# Patient Record
Sex: Male | Born: 1970
Health system: Southern US, Community
[De-identification: ages and names within clinical notes are randomized; demographics above are authoritative.]

---

## 2005-05-06 ENCOUNTER — Emergency Department (HOSPITAL_COMMUNITY): Admission: EM | Admit: 2005-05-06 | Discharge: 2005-05-06 | Payer: Self-pay | Admitting: Family Medicine

## 2005-12-14 ENCOUNTER — Ambulatory Visit (HOSPITAL_BASED_OUTPATIENT_CLINIC_OR_DEPARTMENT_OTHER): Admission: RE | Admit: 2005-12-14 | Discharge: 2005-12-14 | Payer: Self-pay | Admitting: Emergency Medicine

## 2005-12-16 ENCOUNTER — Ambulatory Visit: Payer: Self-pay | Admitting: Internal Medicine

## 2006-02-26 ENCOUNTER — Encounter: Admission: RE | Admit: 2006-02-26 | Discharge: 2006-05-27 | Payer: Self-pay | Admitting: Emergency Medicine

## 2006-03-04 ENCOUNTER — Ambulatory Visit (HOSPITAL_BASED_OUTPATIENT_CLINIC_OR_DEPARTMENT_OTHER): Admission: RE | Admit: 2006-03-04 | Discharge: 2006-03-04 | Payer: Self-pay | Admitting: Emergency Medicine

## 2006-03-11 ENCOUNTER — Ambulatory Visit: Payer: Self-pay | Admitting: Internal Medicine

## 2006-05-11 ENCOUNTER — Ambulatory Visit: Payer: Self-pay | Admitting: Pulmonary Disease

## 2007-04-16 ENCOUNTER — Ambulatory Visit: Payer: Self-pay | Admitting: Family Medicine

## 2007-04-16 DIAGNOSIS — J309 Allergic rhinitis, unspecified: Secondary | ICD-10-CM | POA: Insufficient documentation

## 2007-04-16 DIAGNOSIS — I10 Essential (primary) hypertension: Secondary | ICD-10-CM | POA: Insufficient documentation

## 2007-12-19 ENCOUNTER — Ambulatory Visit: Payer: Self-pay | Admitting: Family Medicine

## 2007-12-19 DIAGNOSIS — J019 Acute sinusitis, unspecified: Secondary | ICD-10-CM | POA: Insufficient documentation

## 2008-01-13 ENCOUNTER — Ambulatory Visit: Payer: Self-pay | Admitting: Family Medicine

## 2008-01-13 LAB — CONVERTED CEMR LAB
Glucose, Urine, Semiquant: NEGATIVE
Ketones, urine, test strip: NEGATIVE
Nitrite: NEGATIVE
Protein, U semiquant: NEGATIVE
WBC Urine, dipstick: NEGATIVE
pH: 5.5

## 2008-01-15 LAB — CONVERTED CEMR LAB
ALT: 50 units/L (ref 0–53)
BUN: 12 mg/dL (ref 6–23)
Basophils Relative: 0.7 % (ref 0.0–3.0)
Cholesterol: 210 mg/dL (ref 0–200)
Direct LDL: 122.1 mg/dL
GFR calc non Af Amer: 89 mL/min
Glucose, Bld: 105 mg/dL — ABNORMAL HIGH (ref 70–99)
HDL: 20.4 mg/dL — ABNORMAL LOW (ref >39.0)
MCV: 87 fL (ref 78.0–100.0)
Neutrophils Relative %: 51.5 % (ref 43.0–77.0)
Platelets: 204 10*3/uL (ref 150–400)
Potassium: 4.4 meq/L (ref 3.5–5.1)
TSH: 1.56 microintl units/mL (ref 0.35–5.50)
Total CHOL/HDL Ratio: 10.3
Total Protein: 7.4 g/dL (ref 6.0–8.3)
VLDL: 72 mg/dL — ABNORMAL HIGH (ref 0–40)
WBC: 7.3 10*3/uL (ref 4.5–10.5)

## 2008-01-20 ENCOUNTER — Ambulatory Visit: Payer: Self-pay | Admitting: Family Medicine

## 2008-06-16 ENCOUNTER — Ambulatory Visit: Payer: Self-pay | Admitting: Family Medicine

## 2008-06-16 ENCOUNTER — Encounter: Payer: Self-pay | Admitting: Family Medicine

## 2008-06-16 DIAGNOSIS — L723 Sebaceous cyst: Secondary | ICD-10-CM | POA: Insufficient documentation

## 2008-06-30 ENCOUNTER — Ambulatory Visit: Payer: Self-pay | Admitting: Family Medicine

## 2008-09-07 ENCOUNTER — Ambulatory Visit: Payer: Self-pay | Admitting: Family Medicine

## 2008-09-07 DIAGNOSIS — R5381 Other malaise: Secondary | ICD-10-CM | POA: Insufficient documentation

## 2008-09-07 DIAGNOSIS — R5383 Other fatigue: Secondary | ICD-10-CM

## 2008-09-09 LAB — CONVERTED CEMR LAB
BUN: 14 mg/dL (ref 6–23)
Basophils Absolute: 0.1 10*3/uL (ref 0.0–0.1)
Bilirubin, Direct: 0 mg/dL (ref 0.0–0.3)
CO2: 27 meq/L (ref 19–32)
Eosinophils Absolute: 0.3 10*3/uL (ref 0.0–0.7)
HCT: 46.8 % (ref 39.0–52.0)
Hemoglobin: 16.4 g/dL (ref 13.0–17.0)
MCHC: 35.1 g/dL (ref 30.0–36.0)
MCV: 86.3 fL (ref 78.0–100.0)
Monocytes Absolute: 0.6 10*3/uL (ref 0.1–1.0)
Platelets: 232 10*3/uL (ref 150.0–400.0)
RBC: 5.42 M/uL (ref 4.22–5.81)
Sodium: 140 meq/L (ref 135–145)
Testosterone: 89.2 ng/dL — ABNORMAL LOW (ref 350.00–890.00)
Total Bilirubin: 0.9 mg/dL (ref 0.3–1.2)
WBC: 8.2 10*3/uL (ref 4.5–10.5)

## 2009-03-12 ENCOUNTER — Emergency Department (HOSPITAL_COMMUNITY): Admission: EM | Admit: 2009-03-12 | Discharge: 2009-03-12 | Payer: Self-pay | Admitting: Emergency Medicine

## 2009-06-22 ENCOUNTER — Ambulatory Visit: Payer: Self-pay | Admitting: Family Medicine

## 2009-06-22 DIAGNOSIS — E291 Testicular hypofunction: Secondary | ICD-10-CM | POA: Insufficient documentation

## 2009-06-22 DIAGNOSIS — N410 Acute prostatitis: Secondary | ICD-10-CM | POA: Insufficient documentation

## 2009-06-23 LAB — CONVERTED CEMR LAB: Testosterone: 109.21 ng/dL — ABNORMAL LOW (ref 350.00–890.00)

## 2009-08-12 ENCOUNTER — Telehealth: Payer: Self-pay | Admitting: Internal Medicine

## 2009-10-19 ENCOUNTER — Telehealth: Payer: Self-pay | Admitting: Family Medicine

## 2010-01-11 ENCOUNTER — Telehealth: Payer: Self-pay | Admitting: Family Medicine

## 2010-01-13 ENCOUNTER — Ambulatory Visit: Payer: Self-pay | Admitting: Family Medicine

## 2010-01-25 ENCOUNTER — Ambulatory Visit: Payer: Self-pay | Admitting: Family Medicine

## 2010-02-01 LAB — CONVERTED CEMR LAB
Alkaline Phosphatase: 57 units/L (ref 39–117)
Basophils Absolute: 0 10*3/uL (ref 0.0–0.1)
Basophils Relative: 0.3 % (ref 0.0–3.0)
Bilirubin Urine: NEGATIVE
Chloride: 104 meq/L (ref 96–112)
Eosinophils Absolute: 0.3 10*3/uL (ref 0.0–0.7)
HCT: 48.8 % (ref 39.0–52.0)
Hemoglobin: 17 g/dL (ref 13.0–17.0)
LDL Cholesterol: 134 mg/dL — ABNORMAL HIGH (ref 0–99)
Leukocytes, UA: NEGATIVE
MCHC: 34.8 g/dL (ref 30.0–36.0)
MCV: 86.6 fL (ref 78.0–100.0)
Monocytes Absolute: 0.6 10*3/uL (ref 0.1–1.0)
Neutrophils Relative %: 53.4 % (ref 43.0–77.0)
Platelets: 202 10*3/uL (ref 150.0–400.0)
Potassium: 4.8 meq/L (ref 3.5–5.1)
Sodium: 138 meq/L (ref 135–145)
Specific Gravity, Urine: 1.025 (ref 1.000–1.030)
Total Bilirubin: 1.1 mg/dL (ref 0.3–1.2)
Total CHOL/HDL Ratio: 8
Total Protein: 6.6 g/dL (ref 6.0–8.3)
Triglycerides: 168 mg/dL — ABNORMAL HIGH (ref 0.0–149.0)
VLDL: 33.6 mg/dL (ref 0.0–40.0)
WBC: 7 10*3/uL (ref 4.5–10.5)

## 2010-02-02 ENCOUNTER — Ambulatory Visit: Payer: Self-pay | Admitting: Family Medicine

## 2010-02-14 ENCOUNTER — Telehealth: Payer: Self-pay | Admitting: Family Medicine

## 2010-02-23 ENCOUNTER — Telehealth: Payer: Self-pay | Admitting: Family Medicine

## 2010-03-08 NOTE — Progress Notes (Signed)
Summary: Rx Refill  Phone Note Refill Request Call back at Home Phone [phone] Message from:  Patient on October 19, 2009 11:35 AM  Refills Requested: Medication #1:  BENAZEPRIL HCL 20 MG  TABS 1 by mouth daily [facility] [address]   Initial call taken by: Trixie Dredge,  October 19, 2009 11:34 AM  Follow-up for Phone Call        rx at [facility] [address] and was called in on 10-15-09 Follow-up by: Pura Spice, RN,  October 19, 2009 12:48 PM

## 2010-03-08 NOTE — Progress Notes (Signed)
Summary: other med  Phone Note Call from Patient Call back at [facility] Phone [phone] Call back at [phone]   Caller: Patient Call For: Nelwyn Salisbury MD Summary of Call: insurance will not cover Androgel, can he have Rx for Testim?  Follow-up for Phone Call        pt called again. he is out of medication and wil need a new rx for testim. [facility]. I expalined to him that dr fry is out of office and maybe another dr can take a look at this. Androgel is not a covered medicine with his ins co. Follow-up by: Warnell Forester,  August 13, 2009 10:21 AM  Additional Follow-up for Phone Call Additional follow up Details #1::        have dr Clent Ridges   do the rx  unsure what dose he wishes  Additional Follow-up by: Madelin Headings MD,  August 13, 2009 5:03 PM    Additional Follow-up for Phone Call Additional follow up Details #2::    call in Testim gel, apply 10 grams daily, 30 days with 5 rf  Follow-up by: Nelwyn Salisbury MD,  August 16, 2009 10:48 AM  New/Updated Medications: TESTIM 1 % GEL (TESTOSTERONE) 10 grams once daily Prescriptions: TESTIM 1 % GEL (TESTOSTERONE) 10 grams once daily  #30 x 5   Entered by:   Raechel Ache, RN   Authorized by:   Nelwyn Salisbury MD   Signed by:   Raechel Ache, RN on 08/16/2009   Method used:   Printed then faxed to ...       [facility]  [id number]       [address]       [place], [place]  [address]       Ph: [phone]       Fax: [phone]   RxID:   [id number]   Appended Document: other med Patient called to check on this.

## 2010-03-08 NOTE — Assessment & Plan Note (Signed)
Summary: fu on meds/njr   Vital Signs:  Patient profile:   40 year old male Weight:      302 pounds BMI:     38.91 BP sitting:   124 / 84  (left arm) Cuff size:   large  Vitals Entered By: Raechel Ache, RN (Jun 22, 2009 9:29 AM) CC: Med check. C/o abd pain off & on, burping, alt diarrhea and constipation.   History of Present Illness: Here to follow up on testosterone supplementation. We evaluated him last August for chronic fatigue and found his testosterone level to be very low at 89. he started on Androgel at that point. Almost immediately he felt better, and now he has excellent energy. However about 2 weeks ago he began to feel symptoms he has never had before. He has increased urgency to urinate, a slower stream, a feeling of incomplete bladder emptying, and some mild perineal aching pains. No urethral DC, no fever.    Allergies (verified): No Known Drug Allergies  Past History:  Past Medical History: Chickenpox Allergic rhinitis Hypertension sleep apnea, uses a CPAP machine  hypogonadism  Past Surgical History: Reviewed history from 04/16/2007 and no changes required. Tonsillectomy  Review of Systems  The patient denies anorexia, fever, weight loss, weight gain, vision loss, decreased hearing, hoarseness, chest pain, syncope, dyspnea on exertion, peripheral edema, prolonged cough, headaches, hemoptysis, abdominal pain, melena, hematochezia, severe indigestion/heartburn, hematuria, incontinence, genital sores, muscle weakness, suspicious skin lesions, transient blindness, difficulty walking, depression, unusual weight change, abnormal bleeding, enlarged lymph nodes, angioedema, breast masses, and testicular masses.    Physical Exam  General:  Well-developed,well-nourished,in no acute distress; alert,appropriate and cooperative throughout examination Rectal:  No external abnormalities noted. Normal sphincter tone. No rectal masses or tenderness. Genitalia:  Testes  bilaterally descended without nodularity, tenderness or masses. No scrotal masses or lesions. No penis lesions or urethral discharge. Prostate:  no nodules, no asymmetry, tender, boggy, and 2+ enlarged.     Impression & Recommendations:  Problem # 1:  ACUTE PROSTATITIS (ICD-601.0)  Problem # 2:  HYPOGONADISM (ICD-257.2)  Orders: Venipuncture (82956) TLB-Testosterone, Total (84403-TESTO)  Problem # 3:  HYPERTENSION (ICD-401.9)  His updated medication list for this problem includes:    Benazepril Hcl 20 Mg Tabs (Benazepril hcl) .Marland Kitchen... 1 by mouth daily  Complete Medication List: 1)  Benazepril Hcl 20 Mg Tabs (Benazepril hcl) .Marland Kitchen.. 1 by mouth daily 2)  Allegra 180 Mg Tabs (Fexofenadine hcl) .Marland Kitchen.. 1 by mouth once daily 3)  Androgel Pump 1 % Gel (Testosterone) .... Apply 10 grams once daily 4)  Cipro 500 Mg Tabs (Ciprofloxacin hcl) .... Two times a day  Patient Instructions: 1)  check another testosterone level. He has a prostate infection, which we will treat. I told him that this should not be related to the Androgel at all.  Prescriptions: CIPRO 500 MG TABS (CIPROFLOXACIN HCL) two times a day  #28 x 0   Entered and Authorized by:   Nelwyn Salisbury MD   Signed by:   Nelwyn Salisbury MD on 06/22/2009   Method used:   Electronically to        [facility]  [address] [phone][facility] (retail)       [address]       [place], [place]  [address]       Ph: [phone]       Fax: [phone]   RxID:   [id number]

## 2010-03-08 NOTE — Progress Notes (Signed)
Summary: testosterone level check  Phone Note Call from Patient Call back at Home Phone [phone]   Caller: Patient Call For: Nelwyn Salisbury MD Summary of Call: pt would like to know if its time for testosterone level bloodwork Initial call taken by: Heron Sabins,  January 11, 2010 11:56 AM  Follow-up for Phone Call        yes it is time. Set him up to check a testosterone level soon Follow-up by: Nelwyn Salisbury MD,  January 11, 2010 1:12 PM  Additional Follow-up for Phone Call Additional follow up Details #1::        Contacted pt and appt for labwork was scheduled for 12/8.  Additional Follow-up by: Debbra Riding,  January 11, 2010 3:02 PM

## 2010-03-10 NOTE — Assessment & Plan Note (Signed)
Summary: cpx/cjr   Vital Signs:  Patient profile:   40 year old male Height:      74 inches Weight:      283 pounds BMI:     36.47 Pulse rate:   68 / minute BP sitting:   118 / 78  (left arm)  Vitals Entered By: Kyung Rudd, CMA (February 02, 2010 2:43 PM) CC: CPX   History of Present Illness: 40 yr old male for a cpx. he feels fine except for some erectile problems. He has been on Testim for a year, and he feels much better in general. He has more energy and sleeps better.His testosterone level is up to 387. He is execising and dieting, and has lost about 20 lbs in the past 6 weeks. His goal is to get down to 235 lbs again.   Current Medications (verified): 1)  Benazepril Hcl 20 Mg  Tabs (Benazepril Hcl) .Marland Kitchen.. 1 By Mouth Daily 2)  Allegra 180 Mg Tabs (Fexofenadine Hcl) .Marland Kitchen.. 1 By Mouth Once Daily 3)  Testim 1 % Gel (Testosterone) .Marland Kitchen.. 10 Grams Once Daily  Allergies (verified): No Known Drug Allergies  Past History:  Past Medical History: Reviewed history from 06/22/2009 and no changes required. Chickenpox Allergic rhinitis Hypertension sleep apnea, uses a CPAP machine  hypogonadism  Past Surgical History: Reviewed history from 04/16/2007 and no changes required. Tonsillectomy  Family History: Reviewed history from 04/16/2007 and no changes required. Family History High cholesterol Family History Hypertension Family History Lung cancer Family History of Prostate CA 1st degree relative <50  Social History: Reviewed history from 04/16/2007 and no changes required. Married Never Smoked Alcohol use-no Drug use-no Regular exercise-no  Review of Systems  The patient denies anorexia, fever, weight gain, vision loss, decreased hearing, hoarseness, chest pain, syncope, dyspnea on exertion, peripheral edema, prolonged cough, headaches, hemoptysis, abdominal pain, melena, hematochezia, severe indigestion/heartburn, hematuria, incontinence, genital sores, muscle  weakness, suspicious skin lesions, transient blindness, difficulty walking, depression, unusual weight change, abnormal bleeding, enlarged lymph nodes, angioedema, breast masses, and testicular masses.    Physical Exam  General:  overweight-appearing.   Head:  Normocephalic and atraumatic without obvious abnormalities. No apparent alopecia or balding. Eyes:  No corneal or conjunctival inflammation noted. EOMI. Perrla. Funduscopic exam benign, without hemorrhages, exudates or papilledema. Vision grossly normal. Ears:  External ear exam shows no significant lesions or deformities.  Otoscopic examination reveals clear canals, tympanic membranes are intact bilaterally without bulging, retraction, inflammation or discharge. Hearing is grossly normal bilaterally. Nose:  External nasal examination shows no deformity or inflammation. Nasal mucosa are pink and moist without lesions or exudates. Mouth:  Oral mucosa and oropharynx without lesions or exudates.  Teeth in good repair. Neck:  No deformities, masses, or tenderness noted. Chest Wall:  No deformities, masses, tenderness or gynecomastia noted. Lungs:  Normal respiratory effort, chest expands symmetrically. Lungs are clear to auscultation, no crackles or wheezes. Heart:  Normal rate and regular rhythm. S1 and S2 normal without gallop, murmur, click, rub or other extra sounds. Abdomen:  Bowel sounds positive,abdomen soft and non-tender without masses, organomegaly or hernias noted. Genitalia:  Testes bilaterally descended without nodularity, tenderness or masses. No scrotal masses or lesions. No penis lesions or urethral discharge. Msk:  No deformity or scoliosis noted of thoracic or lumbar spine.   Pulses:  R and L carotid,radial,femoral,dorsalis pedis and posterior tibial pulses are full and equal bilaterally Extremities:  No clubbing, cyanosis, edema, or deformity noted with normal full range of motion of  all joints.   Neurologic:  No cranial nerve  deficits noted. Station and gait are normal. Plantar reflexes are down-going bilaterally. DTRs are symmetrical throughout. Sensory, motor and coordinative functions appear intact. Skin:  Intact without suspicious lesions or rashes Cervical Nodes:  No lymphadenopathy noted Axillary Nodes:  No palpable lymphadenopathy Inguinal Nodes:  No significant adenopathy Psych:  Cognition and judgment appear intact. Alert and cooperative with normal attention span and concentration. No apparent delusions, illusions, hallucinations   Impression & Recommendations:  Problem # 1:  WELL ADULT EXAM (ICD-V70.0)  Complete Medication List: 1)  Benazepril Hcl 20 Mg Tabs (Benazepril hcl) .Marland Kitchen.. 1 by mouth daily 2)  Allegra 180 Mg Tabs (Fexofenadine hcl) .Marland Kitchen.. 1 by mouth once daily 3)  Testim 1 % Gel (Testosterone) .Marland Kitchen.. 10 grams once daily 4)  Cialis 20 Mg Tabs (Tadalafil) .... As needed  Patient Instructions: 1)  Continue current meds. Try samples of Cialis.  Prescriptions: TESTIM 1 % GEL (TESTOSTERONE) 10 grams once daily  #30 x 5   Entered and Authorized by:   Nelwyn Salisbury MD   Signed by:   Nelwyn Salisbury MD on 02/02/2010   Method used:   Print then Give to Patient   RxID:   [id number]    Orders Added: 1)  Est. Patient 18-39 years [[id number]]

## 2010-03-10 NOTE — Progress Notes (Signed)
Summary: med refill  Phone Note Refill Request Call back at [phone] Message from:  Patient  Refills Requested: Medication #1:  TESTIM 1 % GEL 10 grams once daily pt lost rx needs [facility]ill. [facility] [address] [phone]  Initial call taken by: Heron Sabins,  February 14, 2010 1:56 PM  Follow-up for Phone Call        pt called to check on status of script for testim. Pt has been off med for 2 days.  Follow-up by: Lucy Antigua,  February 14, 2010 4:29 PM  Additional Follow-up for Phone Call Additional follow up Details #1::        pt called to let him know dr Avenir Lozinski know he is checking his mess  Additional Follow-up by: Pura Spice, RN,  February 15, 2010 1:05 PM    Additional Follow-up for Phone Call Additional follow up Details #2::    call in a 6 month supply for this  Follow-up by: Nelwyn Salisbury MD,  February 15, 2010 1:40 PM  Additional Follow-up for Phone Call Additional follow up Details #3:: Details for Additional Follow-up Action Taken: [facility]  [facility] aware  Additional Follow-up by: Pura Spice, RN,  February 15, 2010 3:38 PM  New/Updated Medications: TESTIM 1 % GEL (TESTOSTERONE) 10 grams once daily Prescriptions: TESTIM 1 % GEL (TESTOSTERONE) 10 grams once daily  #1 x 5   Entered by:   Pura Spice, RN   Authorized by:   Nelwyn Salisbury MD   Signed by:   Nelwyn Salisbury MD on 02/15/2010   Method used:   Telephoned to ...       [facility]  [address] [id number]       [address]       [place], [place]  [address]       Ph: [phone]       Fax: [phone]   RxID:   [id number]

## 2010-03-10 NOTE — Progress Notes (Signed)
Summary: Cialis  Phone Note Call from Patient   Caller: Patient Call For: Timothy Salisbury MD Summary of Call: Pt need pres of Cialis sent to [facility], please. Initial call taken by: Lynann Beaver CMA AAMA,  February 23, 2010 9:31 AM  Follow-up for Phone Call        call in #10 with 11 rf  Follow-up by: Timothy Salisbury MD,  February 23, 2010 2:54 PM    Prescriptions: CIALIS 20 MG TABS (TADALAFIL) as needed  #10 x 11   Entered by:   Duard Brady LPN   Authorized by:   Timothy Salisbury MD   Signed by:   Duard Brady LPN on 13/09/6576   Method used:   Faxed to ...       [facility]  [address] [id number]       [address]       [place], [place]  [address]       Ph: [phone]       Fax: [phone]   RxID:   [id number]

## 2010-06-24 NOTE — Procedures (Signed)
NAME:  Timothy Berry, Timothy Berry        ACCOUNT NO.:  [id number]   MEDICAL RECORD NO.:  [id number]          PATIENT TYPE:  OUT   LOCATION:  SLEEP CENTER                 FACILITY:  [facility]   PHYSICIAN:  Clinton D. Maple Hudson, MD, FCCP, FACPDATE OF BIRTH:  12-16-70   DATE OF STUDY:                              NOCTURNAL POLYSOMNOGRAM   DATE OF STUDY:  December 14, 2005.   INDICATION FOR STUDY:  Hypersomnia with sleep apnea.  Epworth Sleepiness  Score 12/24, BMI 36, weight 284 pounds.   HOME MEDICATIONS:  HCTZ, Benazepril.   SLEEP ARCHITECTURE:  Total sleep time 241 minutes with sleep efficiency 60%.  Stage 1 was 19%; stage 2, 81%; stages 3, 4, and REM were absent, sleep  latency 56 minutes, awake after sleep onset 107 minutes, arousal index  increased at 63.6.  There was frequent waking throughout the night with  sustained wakefulness especially between 1 and 2 a.m.  No bedtime medication  was reported.   RESPIRATORY DATA:  Apnea/hypopnea index (AHI, RDI) 61.6 obstructive events  per hour indicating severe obstructive sleep apnea/hypopnea syndrome.  There  were 4 central apneas, 128 obstructive apneas, 2 mixed apneas, and 114  hypopneas.  Events were most common as expected while sleeping supine but  were also significantly frequent and not truly positional, in other sleep  positions especially while on right side.  Because of frequent waking, there  was insufficient sleep to permit CPAP titration by split protocol on this  study night.   OXYGEN DATA:  Moderately loud snoring with oxygen desaturation to a nadir of  86%.  Mean oxygen saturation through the study was 95% on room air.   CARDIAC DATA:  Normal sinus rhythm.   MOVEMENT/PARASOMNIA:  Occasional leg jerk, insignificant.  Bathroom x1.   IMPRESSION/RECOMMENDATION:  1. Short, fragmented, total sleep time.  At least some of this reflected      respiratory disturbance.  2. Severe obstructive sleep apnea/hypopnea syndrome, AHI 61.6 per  hour.      Events were more common while      supine but not truly positional.  Moderate snoring with oxygen      desaturation to a nadir of 86%.  3. Consider return for CPAP titration or evaluate for alternative      therapies as appropriate.      Clinton D. Maple Hudson, MD, [facility], FACP  Diplomate, Biomedical engineer of Sleep Medicine  Electronically Signed     CDY/MEDQ  D:  12/16/2005 12:25:45  T:  12/16/2005 16:40:29  Job:  [id number]

## 2010-06-24 NOTE — Procedures (Signed)
NAME:  Timothy Berry, Timothy Berry        ACCOUNT NO.:  [id number]   MEDICAL RECORD NO.:  [id number]          PATIENT TYPE:  OUT   LOCATION:  SLEEP CENTER                 FACILITY:  [facility]   PHYSICIAN:  Clinton D. Maple Hudson, MD, FCCP, FACPDATE OF BIRTH:  09-29-1970   DATE OF STUDY:  03/04/2006                            NOCTURNAL POLYSOMNOGRAM   INDICATION FOR STUDY:  Insomnia with sleep apnea.   RESULTS:  Epward sleepiness score 7/24. BMI 35.2. Weight 275 pounds.   HOME MEDICATIONS:  Benazepril, HCTZ.   A diagnostic NP SG on December 14, 2005 recorded an apnea/hypopnea index  of 61.6 per hour. CPAP titration is requested.   SLEEP ARCHITECTURE:  Total sleep time 303 minutes with sleep efficiency  76%. Stage 1 was 17%; Stage 2, 53%; Stages 3 and 4, 12%. REM 18% of  total sleep time. Sleep latency 19 minutes, REM latency 96 minutes,  awake after sleep onset 71 minutes. Arousal index 11.9. No bedtime  medication was taken.   RESPIRATORY DATA:  CPAP titration protocol. CPAP was titrated to 10 CWP,  AHI 7.7 per hour. The technician commented that he was very sensitive to  pressure changes with arousal and awakening. Optimal pressure appears to  be 9 CWP, AHI 5.1 per hour. A small ResMed Quattro full face mask was  used with a heated humidifier.   OXYGEN DATA:  Minimal snoring at CPAP of 9 CWP with oxygen saturation  holding 96% on room air.   CARDIAC DATA:  Normal sinus rhythm.   MOVEMENT/PARASOMNIA:  Occasional limb jerk, insignificant.   IMPRESSION/RECOMMENDATIONS:  1. CPAP titration to an optimal pressure of 9 CWP, AHI 5.1 per hour.      Higher pressures caused more arousals. A small ResMed Quattro mask      was used with heated humidifier.  2. Baseline diagnostic NP SG on December 14, 2005 recorded an AHI of      61.6 per hour      Clinton D. Maple Hudson, MD, [facility], FACP  Diplomate, Biomedical engineer of Sleep Medicine  Electronically Signed    CDY/MEDQ  D:  03/11/2006 08:43:28  T:   03/11/2006 17:45:37  Job:  [id number]

## 2010-07-21 ENCOUNTER — Ambulatory Visit (INDEPENDENT_AMBULATORY_CARE_PROVIDER_SITE_OTHER): Payer: 59 | Admitting: Internal Medicine

## 2010-07-21 ENCOUNTER — Encounter: Payer: Self-pay | Admitting: Internal Medicine

## 2010-07-21 DIAGNOSIS — M25569 Pain in unspecified knee: Secondary | ICD-10-CM

## 2010-07-21 DIAGNOSIS — M25561 Pain in right knee: Secondary | ICD-10-CM

## 2010-07-21 MED ORDER — DICLOFENAC SODIUM 75 MG PO TBEC: DELAYED_RELEASE_TABLET | ORAL | Status: AC

## 2010-07-21 NOTE — Assessment & Plan Note (Signed)
Suspect ligamental injury. Attempt voltaren bid x 5 days then bid prn with food and no other nsaids. Followup closely if no improvement or worsening.

## 2010-07-21 NOTE — Progress Notes (Signed)
  Subjective:    Patient ID: Timothy Berry, male    DOB: 06/09/1970, 39 y.o.   MRN: [id number]  HPI Pt presents to clinic for evaluation of knee pain. Notes one week h/o progressive right knee pain. No h/o trauma but during stretching exercise noticed initial pain. Runs regularly for exercise and notes exacerbation with this as well as certain positions (?torsion). No instability but occasionally during running may buckle slightly. No associated swelling and pain is generally located medial aspect. Attempted otc advil prn. No other exacerbating or alleviating factors. No other complaints.  Reviewed pmh, medications and allergies    Review of Systems  Constitutional: Negative for fever and chills.  Musculoskeletal: Positive for arthralgias. Negative for myalgias, back pain, joint swelling and gait problem.  Skin: Negative for rash.       Objective:   Physical Exam  Nursing note and vitals reviewed. Constitutional: He appears well-developed and well-nourished. No distress.  HENT:  Head: Normocephalic and atraumatic.  Musculoskeletal:       Right knee: no erythema, warmth or effusion.+mild tenderness along medial aspect without bony abn. FROM noted without crepitus. Ant drawers neg. Gait nl.  Neurological: He is alert.  Skin: Skin is warm and dry. He is not diaphoretic.  Psychiatric: He has a normal mood and affect.          Assessment & Plan:

## 2010-08-15 ENCOUNTER — Telehealth: Payer: Self-pay | Admitting: Family Medicine

## 2010-08-15 MED ORDER — TESTOSTERONE 50 MG/5GM (1%) TD GEL: 10.0000 g | Freq: Every day | TRANSDERMAL | Status: DC

## 2010-08-15 NOTE — Telephone Encounter (Signed)
Spoke with pharmacy

## 2010-12-15 ENCOUNTER — Other Ambulatory Visit: Payer: Self-pay | Admitting: Family Medicine

## 2010-12-15 MED ORDER — TESTOSTERONE 50 MG/5GM (1%) TD GEL: 10.0000 g | Freq: Every day | TRANSDERMAL | Status: AC

## 2010-12-15 NOTE — Telephone Encounter (Signed)
rx called into pharmacy. Pt aware. 

## 2010-12-15 NOTE — Telephone Encounter (Signed)
Pt called back to check on status of get refill of Testim 50 mg called in to [facility] [address] [phone]. Pt out of med.

## 2010-12-15 NOTE — Telephone Encounter (Signed)
Pt need refill testim 50 mg [facility] [phone]

## 2010-12-15 NOTE — Telephone Encounter (Signed)
He uses 10 grams a day. Call in 6 month supply
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
## 2014-10-13 DIAGNOSIS — R0602 Shortness of breath: Secondary | ICD-10-CM

## 2014-10-13 DIAGNOSIS — R062 Wheezing: Secondary | ICD-10-CM | POA: Insufficient documentation

## 2014-10-13 DIAGNOSIS — H1010 Acute atopic conjunctivitis, unspecified eye: Secondary | ICD-10-CM | POA: Insufficient documentation

## 2014-10-13 DIAGNOSIS — J309 Allergic rhinitis, unspecified: Secondary | ICD-10-CM | POA: Insufficient documentation

## 2014-11-06 ENCOUNTER — Ambulatory Visit (INDEPENDENT_AMBULATORY_CARE_PROVIDER_SITE_OTHER): Payer: 59 | Admitting: *Deleted

## 2014-11-06 DIAGNOSIS — J309 Allergic rhinitis, unspecified: Secondary | ICD-10-CM

## 2014-11-12 ENCOUNTER — Ambulatory Visit (INDEPENDENT_AMBULATORY_CARE_PROVIDER_SITE_OTHER): Payer: 59 | Admitting: Neurology

## 2014-11-12 DIAGNOSIS — J309 Allergic rhinitis, unspecified: Secondary | ICD-10-CM | POA: Diagnosis not present

## 2014-11-25 ENCOUNTER — Ambulatory Visit (INDEPENDENT_AMBULATORY_CARE_PROVIDER_SITE_OTHER): Payer: 59 | Admitting: Allergy and Immunology

## 2014-11-25 ENCOUNTER — Encounter: Payer: Self-pay | Admitting: Allergy and Immunology

## 2014-11-25 VITALS — BP 130/80 | HR 76 | Resp 16

## 2014-11-25 DIAGNOSIS — R062 Wheezing: Secondary | ICD-10-CM | POA: Diagnosis not present

## 2014-11-25 DIAGNOSIS — J3089 Other allergic rhinitis: Secondary | ICD-10-CM | POA: Diagnosis not present

## 2014-11-25 DIAGNOSIS — J0100 Acute maxillary sinusitis, unspecified: Secondary | ICD-10-CM

## 2014-11-25 DIAGNOSIS — J019 Acute sinusitis, unspecified: Secondary | ICD-10-CM | POA: Insufficient documentation

## 2014-11-25 MED ORDER — PREDNISONE 10 MG PO TABS: ORAL_TABLET | ORAL | Status: AC

## 2014-11-25 MED ORDER — PREDNISONE 1 MG PO TABS: 10.0000 mg | ORAL_TABLET | ORAL | Status: AC

## 2014-11-25 MED ORDER — AZELASTINE-FLUTICASONE 137-50 MCG/ACT NA SUSP: 1.0000 | Freq: Two times a day (BID) | NASAL | Status: AC

## 2014-11-25 MED ORDER — AZELASTINE-FLUTICASONE 137-50 MCG/ACT NA SUSP: 2.0000 | Freq: Two times a day (BID) | NASAL | Status: AC | PRN

## 2014-11-25 NOTE — Assessment & Plan Note (Addendum)
Currently quiescent.  Continue albuterol HFA, 1-2 inhalations via spacer device every 4-6 hours as needed.  Subjective and objective measures of pulmonary function will be followed and the treatment plan will be adjusted accordingly.

## 2014-11-25 NOTE — Addendum Note (Signed)
Addended by: Janace Hoard on: 11/25/2014 02:21 PM   Modules accepted: Orders

## 2014-11-25 NOTE — Progress Notes (Signed)
History of present illness: HPI Comments: Timothy Berry is a 44 y.o. male with allergic rhinitis on immunotherapy buildup and intermittent coughing/wheezing presenting today for sick visit.  He reports that over the past 3 weeks he has experienced sinus pressure/pain between the eyes and over the cheek bones, nasal congestion, postnasal drainage, and irritated throat.  He denies fevers or chills.  He was started on Augmentin 2 days ago by his primary care physician that he is still experiencing significant sinus pressure/pain.  He has not experienced dyspnea or wheezing recently and states that he typically only wheezes during the spring pollen season.  He is tolerating aeroallergen immunotherapy buildup without complications.     Assessment and plan: Acute sinusitis  Continue/finish course of Augmentin as prescribed.  Prednisone has been provided, 20 mg x 4 days, 10 mg x1 day, then stop.  A prescription has been provided for Dymista (azelastine/fluticasone) nasal spray, one spray per nostril twice daily as needed. Proper nasal spray technique has been discussed and demonstrated.  Nasal saline lavage as needed has been recommended along with instructions for proper administration.  Guaifenesin 1200 mg (plus/minus pseudoephedrine 120 mg) twice daily as needed with adequate hydration. Pseudoephedrine is only to be used for short-term relief of nasal/sinus congestion. Long-term use is discouraged due to potential side effects.  Timothy Berry has verbalized understanding.  Allergic rhinitis  A prescription has been provided for Dymista (as above).  Nasal saline irrigation is recommended (as above).  Continue montelukast 10 mg daily at bedtime.  Hold levocetirizine until sinusitis is resolved.  Resume aeroallergen immunotherapy when sinusitis is resolved.    Intermittent coughing/wheezing Currently quiescent.  Continue albuterol HFA, 1-2 inhalations via spacer device every 4-6  hours as needed.  Subjective and objective measures of pulmonary function will be followed and the treatment plan will be adjusted accordingly.    Medications ordered this encounter: Meds ordered this encounter  Medications  . Azelastine-Fluticasone (DYMISTA) 137-50 MCG/ACT SUSP    Sig: Place 2 sprays into both nostrils 2 (two) times daily as needed.    Dispense:  1 Bottle    Refill:  5  . predniSONE (DELTASONE) tablet 10 mg    Sig: 20 mg 4 days, 10 mg 1 day, then stop.      Diagnositics: Spirometry reveals FVC of 4.27 L and FEV1 of 3.36 L (71% predicted) without post bronchodilator improvement.     Physical examination: Blood pressure 130/80, pulse 76, resp. rate 16.  General: Alert, interactive, in no acute distress. HEENT: TMs pearly gray, turbinates moderately edematous without discharge, post-pharynx markedly erythematous. Neck: Supple without lymphadenopathy. Lungs: Clear to auscultation without wheezing, rhonchi or rales. CV: Normal S1, S2 without murmurs. Skin: Warm and dry, without lesions or rashes.  The following portions of the patient's history were reviewed and updated as appropriate: allergies, current medications, past family history, past medical history, past social history, past surgical history and problem list.   Review of systems: Constitutional: Negative for fever, chills and weight loss.  HENT: Negative for nosebleeds.   Eyes: Negative for blurred vision.  Respiratory: Negative for hemoptysis.   Cardiovascular: Negative for chest pain.  Gastrointestinal: Negative for diarrhea and constipation.  Genitourinary: Negative for dysuria.  Musculoskeletal: Negative for myalgias and joint pain.  Neurological: Negative for dizziness.  Endo/Heme/Allergies: Does not bruise/bleed easily.   Outpatient medications:   Medication List       This list is accurate as of: 11/25/14  1:05 PM.  Always use your most recent med  list.                amoxicillin-clavulanate 875-125 MG tablet  Commonly known as:  AUGMENTIN  Take 1 tablet by mouth 2 (two) times daily.     Azelastine-Fluticasone 137-50 MCG/ACT Susp  Commonly known as:  DYMISTA  Place 2 sprays into both nostrils 2 (two) times daily as needed.     EPIPEN 2-PAK 0.3 mg/0.3 mL Soaj injection  Generic drug:  EPINEPHrine  Inject 0.3 mg into the muscle once.     levocetirizine 5 MG tablet  Commonly known as:  XYZAL  Take 1 tablet by mouth daily.     montelukast 10 MG tablet  Commonly known as:  SINGULAIR  Take 1 tablet by mouth at bedtime.     multivitamin capsule  Take 1 capsule by mouth daily.     naproxen 500 MG tablet  Commonly known as:  NAPROSYN  Take 1 tablet by mouth daily as needed.     PROAIR RESPICLICK 092 (90 BASE) MCG/ACT Aepb  Generic drug:  Albuterol Sulfate  Inhale 1 puff into the lungs as needed.     testosterone 50 MG/5GM (1%) Gel  Commonly known as:  TESTIM  Place 10 g onto the skin daily.        Known medication allergies: No Known Allergies  I appreciate the opportunity to take part in this Timothy Berry's care. Please do not hesitate to contact me with questions.  Sincerely,   R. Edgar Frisk, MD

## 2014-11-25 NOTE — Addendum Note (Signed)
Addended by: Janace Hoard on: 11/25/2014 02:09 PM   Modules accepted: Orders

## 2014-11-25 NOTE — Assessment & Plan Note (Addendum)
   A prescription has been provided for Dymista (as above).  Nasal saline irrigation is recommended (as above).  Continue montelukast 10 mg daily at bedtime.  Hold levocetirizine until sinusitis is resolved.  Resume aeroallergen immunotherapy when sinusitis is resolved.

## 2014-11-25 NOTE — Assessment & Plan Note (Addendum)
   Continue/finish course of Augmentin as prescribed.  Prednisone has been provided, 20 mg x 4 days, 10 mg x1 day, then stop.  A prescription has been provided for Dymista (azelastine/fluticasone) nasal spray, one spray per nostril twice daily as needed. Proper nasal spray technique has been discussed and demonstrated.  Nasal saline lavage as needed has been recommended along with instructions for proper administration.  Guaifenesin 1200 mg (plus/minus pseudoephedrine 120 mg) twice daily as needed with adequate hydration. Pseudoephedrine is only to be used for short-term relief of nasal/sinus congestion. Long-term use is discouraged due to potential side effects.  Timothy Berry has verbalized understanding.

## 2014-11-25 NOTE — Addendum Note (Signed)
Addended by: Janan Ridge E on: 11/25/2014 02:03 PM   Modules accepted: Miquel Dunn

## 2014-11-25 NOTE — Patient Instructions (Addendum)
Acute sinusitis  Continue/finish course of Augmentin as prescribed.  Prednisone has been provided, 20 mg x 4 days, 10 mg x1 day, then stop.  A prescription has been provided for Dymista (azelastine/fluticasone) nasal spray, one spray per nostril twice daily as needed. Proper nasal spray technique has been discussed and demonstrated.  Nasal saline lavage as needed has been recommended along with instructions for proper administration.  Guaifenesin 1200 mg (plus/minus pseudoephedrine 120 mg) twice daily as needed with adequate hydration. Pseudoephedrine is only to be used for short-term relief of nasal/sinus congestion. Long-term use is discouraged due to potential side effects.  Timothy Berry has verbalized understanding.  Allergic rhinitis  A prescription has been provided for Dymista (as above).  Nasal saline irrigation is recommended (as above).  Continue montelukast 10 mg daily at bedtime.  Hold levocetirizine until sinusitis is resolved.  Resume aeroallergen immunotherapy when sinusitis is resolved.    Intermittent coughing/wheezing Currently quiescent.  Continue albuterol HFA, 1-2 inhalations via spacer device every 4-6 hours as needed.  Subjective and objective measures of pulmonary function will be followed and the treatment plan will be adjusted accordingly.    Return in about 6 months (around 05/26/2015), or if symptoms worsen or fail to improve.

## 2014-11-27 ENCOUNTER — Telehealth: Payer: Self-pay | Admitting: Allergy and Immunology

## 2014-11-27 NOTE — Telephone Encounter (Signed)
Insurance requires PA for Dymista nasal spray. Insurance [facility]) prefers: azelastine, fluticasone, mometasone, or triamcinolone spray.  Would you like to switch patient to a preferred medicine or complete the PA for Dymista?

## 2014-11-30 NOTE — Telephone Encounter (Signed)
Place in a prior authorization for Dymista. Thanks.

## 2014-11-30 NOTE — Telephone Encounter (Signed)
Please clarify below Dr. Verlin Fester

## 2014-11-30 NOTE — Telephone Encounter (Signed)
Please submit prior authorization for tenderness to please.

## 2014-12-03 ENCOUNTER — Ambulatory Visit (INDEPENDENT_AMBULATORY_CARE_PROVIDER_SITE_OTHER): Payer: 59 | Admitting: Neurology

## 2014-12-03 DIAGNOSIS — J309 Allergic rhinitis, unspecified: Secondary | ICD-10-CM

## 2014-12-10 ENCOUNTER — Ambulatory Visit (INDEPENDENT_AMBULATORY_CARE_PROVIDER_SITE_OTHER): Payer: 59

## 2014-12-10 DIAGNOSIS — J309 Allergic rhinitis, unspecified: Secondary | ICD-10-CM | POA: Diagnosis not present

## 2014-12-17 ENCOUNTER — Ambulatory Visit (INDEPENDENT_AMBULATORY_CARE_PROVIDER_SITE_OTHER): Payer: 59

## 2014-12-17 DIAGNOSIS — J309 Allergic rhinitis, unspecified: Secondary | ICD-10-CM | POA: Diagnosis not present

## 2014-12-24 ENCOUNTER — Ambulatory Visit (INDEPENDENT_AMBULATORY_CARE_PROVIDER_SITE_OTHER): Payer: 59

## 2014-12-24 DIAGNOSIS — J309 Allergic rhinitis, unspecified: Secondary | ICD-10-CM

## 2014-12-30 ENCOUNTER — Ambulatory Visit (INDEPENDENT_AMBULATORY_CARE_PROVIDER_SITE_OTHER): Payer: 59 | Admitting: *Deleted

## 2014-12-30 DIAGNOSIS — J309 Allergic rhinitis, unspecified: Secondary | ICD-10-CM

## 2015-01-07 ENCOUNTER — Ambulatory Visit (INDEPENDENT_AMBULATORY_CARE_PROVIDER_SITE_OTHER): Payer: 59

## 2015-01-07 DIAGNOSIS — J309 Allergic rhinitis, unspecified: Secondary | ICD-10-CM | POA: Diagnosis not present

## 2015-01-15 ENCOUNTER — Ambulatory Visit (INDEPENDENT_AMBULATORY_CARE_PROVIDER_SITE_OTHER): Payer: 59 | Admitting: *Deleted

## 2015-01-15 DIAGNOSIS — J309 Allergic rhinitis, unspecified: Secondary | ICD-10-CM

## 2015-01-27 ENCOUNTER — Ambulatory Visit (INDEPENDENT_AMBULATORY_CARE_PROVIDER_SITE_OTHER): Payer: 59

## 2015-01-27 DIAGNOSIS — J309 Allergic rhinitis, unspecified: Secondary | ICD-10-CM

## 2015-02-02 ENCOUNTER — Ambulatory Visit (INDEPENDENT_AMBULATORY_CARE_PROVIDER_SITE_OTHER): Payer: 59

## 2015-02-02 DIAGNOSIS — J309 Allergic rhinitis, unspecified: Secondary | ICD-10-CM | POA: Diagnosis not present

## 2015-02-05 ENCOUNTER — Ambulatory Visit (INDEPENDENT_AMBULATORY_CARE_PROVIDER_SITE_OTHER): Payer: 59 | Admitting: Neurology

## 2015-02-05 DIAGNOSIS — J309 Allergic rhinitis, unspecified: Secondary | ICD-10-CM

## 2015-02-11 ENCOUNTER — Ambulatory Visit (INDEPENDENT_AMBULATORY_CARE_PROVIDER_SITE_OTHER): Payer: 59

## 2015-02-11 DIAGNOSIS — J309 Allergic rhinitis, unspecified: Secondary | ICD-10-CM | POA: Diagnosis not present

## 2015-02-18 ENCOUNTER — Ambulatory Visit (INDEPENDENT_AMBULATORY_CARE_PROVIDER_SITE_OTHER): Payer: 59

## 2015-02-18 DIAGNOSIS — J309 Allergic rhinitis, unspecified: Secondary | ICD-10-CM | POA: Diagnosis not present

## 2015-02-23 ENCOUNTER — Ambulatory Visit (INDEPENDENT_AMBULATORY_CARE_PROVIDER_SITE_OTHER): Payer: 59

## 2015-02-23 DIAGNOSIS — J309 Allergic rhinitis, unspecified: Secondary | ICD-10-CM | POA: Diagnosis not present

## 2015-03-04 ENCOUNTER — Ambulatory Visit (INDEPENDENT_AMBULATORY_CARE_PROVIDER_SITE_OTHER): Payer: 59 | Admitting: Neurology

## 2015-03-04 DIAGNOSIS — J309 Allergic rhinitis, unspecified: Secondary | ICD-10-CM

## 2015-03-11 ENCOUNTER — Ambulatory Visit (INDEPENDENT_AMBULATORY_CARE_PROVIDER_SITE_OTHER): Payer: 59

## 2015-03-11 DIAGNOSIS — J309 Allergic rhinitis, unspecified: Secondary | ICD-10-CM | POA: Diagnosis not present

## 2015-03-17 ENCOUNTER — Ambulatory Visit (INDEPENDENT_AMBULATORY_CARE_PROVIDER_SITE_OTHER): Payer: 59

## 2015-03-17 DIAGNOSIS — J309 Allergic rhinitis, unspecified: Secondary | ICD-10-CM

## 2015-03-25 ENCOUNTER — Ambulatory Visit (INDEPENDENT_AMBULATORY_CARE_PROVIDER_SITE_OTHER): Payer: 59

## 2015-03-25 DIAGNOSIS — J309 Allergic rhinitis, unspecified: Secondary | ICD-10-CM | POA: Diagnosis not present

## 2015-04-01 ENCOUNTER — Ambulatory Visit (INDEPENDENT_AMBULATORY_CARE_PROVIDER_SITE_OTHER): Payer: 59

## 2015-04-01 DIAGNOSIS — J309 Allergic rhinitis, unspecified: Secondary | ICD-10-CM

## 2015-04-15 ENCOUNTER — Ambulatory Visit (INDEPENDENT_AMBULATORY_CARE_PROVIDER_SITE_OTHER): Payer: 59

## 2015-04-15 DIAGNOSIS — J309 Allergic rhinitis, unspecified: Secondary | ICD-10-CM

## 2015-04-23 ENCOUNTER — Ambulatory Visit (INDEPENDENT_AMBULATORY_CARE_PROVIDER_SITE_OTHER): Payer: 59 | Admitting: *Deleted

## 2015-04-23 DIAGNOSIS — J309 Allergic rhinitis, unspecified: Secondary | ICD-10-CM | POA: Diagnosis not present

## 2015-04-30 ENCOUNTER — Ambulatory Visit (INDEPENDENT_AMBULATORY_CARE_PROVIDER_SITE_OTHER): Payer: 59

## 2015-04-30 DIAGNOSIS — J309 Allergic rhinitis, unspecified: Secondary | ICD-10-CM

## 2015-05-14 ENCOUNTER — Ambulatory Visit (INDEPENDENT_AMBULATORY_CARE_PROVIDER_SITE_OTHER): Payer: 59 | Admitting: *Deleted

## 2015-05-14 DIAGNOSIS — J309 Allergic rhinitis, unspecified: Secondary | ICD-10-CM | POA: Diagnosis not present

## 2015-06-03 ENCOUNTER — Ambulatory Visit (INDEPENDENT_AMBULATORY_CARE_PROVIDER_SITE_OTHER): Payer: 59

## 2015-06-03 DIAGNOSIS — J309 Allergic rhinitis, unspecified: Secondary | ICD-10-CM | POA: Diagnosis not present

## 2015-06-17 ENCOUNTER — Ambulatory Visit (INDEPENDENT_AMBULATORY_CARE_PROVIDER_SITE_OTHER): Payer: 59

## 2015-06-17 DIAGNOSIS — J309 Allergic rhinitis, unspecified: Secondary | ICD-10-CM | POA: Diagnosis not present

## 2015-06-23 DIAGNOSIS — J3089 Other allergic rhinitis: Secondary | ICD-10-CM | POA: Diagnosis not present

## 2015-06-24 DIAGNOSIS — J301 Allergic rhinitis due to pollen: Secondary | ICD-10-CM | POA: Diagnosis not present

## 2015-06-25 DIAGNOSIS — J3081 Allergic rhinitis due to animal (cat) (dog) hair and dander: Secondary | ICD-10-CM | POA: Diagnosis not present

## 2015-07-02 ENCOUNTER — Ambulatory Visit (INDEPENDENT_AMBULATORY_CARE_PROVIDER_SITE_OTHER): Payer: 59 | Admitting: *Deleted

## 2015-07-02 DIAGNOSIS — J309 Allergic rhinitis, unspecified: Secondary | ICD-10-CM

## 2015-07-22 ENCOUNTER — Ambulatory Visit (INDEPENDENT_AMBULATORY_CARE_PROVIDER_SITE_OTHER): Payer: 59

## 2015-07-22 DIAGNOSIS — J309 Allergic rhinitis, unspecified: Secondary | ICD-10-CM | POA: Diagnosis not present

## 2015-08-04 ENCOUNTER — Ambulatory Visit (INDEPENDENT_AMBULATORY_CARE_PROVIDER_SITE_OTHER): Payer: 59

## 2015-08-04 DIAGNOSIS — J309 Allergic rhinitis, unspecified: Secondary | ICD-10-CM | POA: Diagnosis not present

## 2015-08-06 ENCOUNTER — Other Ambulatory Visit: Payer: Self-pay | Admitting: Allergy and Immunology

## 2015-08-12 ENCOUNTER — Ambulatory Visit (INDEPENDENT_AMBULATORY_CARE_PROVIDER_SITE_OTHER): Payer: 59

## 2015-08-12 DIAGNOSIS — J309 Allergic rhinitis, unspecified: Secondary | ICD-10-CM | POA: Diagnosis not present

## 2015-08-19 ENCOUNTER — Ambulatory Visit (INDEPENDENT_AMBULATORY_CARE_PROVIDER_SITE_OTHER): Payer: 59

## 2015-08-19 DIAGNOSIS — J309 Allergic rhinitis, unspecified: Secondary | ICD-10-CM

## 2015-09-02 ENCOUNTER — Ambulatory Visit (INDEPENDENT_AMBULATORY_CARE_PROVIDER_SITE_OTHER): Payer: 59

## 2015-09-02 DIAGNOSIS — J309 Allergic rhinitis, unspecified: Secondary | ICD-10-CM

## 2015-09-07 ENCOUNTER — Other Ambulatory Visit: Payer: Self-pay | Admitting: Allergy and Immunology

## 2015-09-24 ENCOUNTER — Ambulatory Visit (INDEPENDENT_AMBULATORY_CARE_PROVIDER_SITE_OTHER): Payer: 59

## 2015-09-24 DIAGNOSIS — J309 Allergic rhinitis, unspecified: Secondary | ICD-10-CM | POA: Diagnosis not present

## 2015-10-02 ENCOUNTER — Other Ambulatory Visit: Payer: Self-pay | Admitting: Allergy and Immunology

## 2015-10-06 ENCOUNTER — Ambulatory Visit (INDEPENDENT_AMBULATORY_CARE_PROVIDER_SITE_OTHER): Payer: 59 | Admitting: *Deleted

## 2015-10-06 DIAGNOSIS — J309 Allergic rhinitis, unspecified: Secondary | ICD-10-CM

## 2015-10-13 ENCOUNTER — Ambulatory Visit (INDEPENDENT_AMBULATORY_CARE_PROVIDER_SITE_OTHER): Payer: 59

## 2015-10-13 DIAGNOSIS — J309 Allergic rhinitis, unspecified: Secondary | ICD-10-CM

## 2015-10-29 ENCOUNTER — Ambulatory Visit (INDEPENDENT_AMBULATORY_CARE_PROVIDER_SITE_OTHER): Payer: 59

## 2015-10-29 DIAGNOSIS — J309 Allergic rhinitis, unspecified: Secondary | ICD-10-CM

## 2015-11-03 ENCOUNTER — Ambulatory Visit (INDEPENDENT_AMBULATORY_CARE_PROVIDER_SITE_OTHER): Payer: 59 | Admitting: *Deleted

## 2015-11-03 DIAGNOSIS — J309 Allergic rhinitis, unspecified: Secondary | ICD-10-CM

## 2015-11-09 ENCOUNTER — Other Ambulatory Visit: Payer: Self-pay | Admitting: Allergy and Immunology

## 2015-11-15 ENCOUNTER — Ambulatory Visit (INDEPENDENT_AMBULATORY_CARE_PROVIDER_SITE_OTHER): Payer: 59 | Admitting: *Deleted

## 2015-11-15 DIAGNOSIS — J3089 Other allergic rhinitis: Secondary | ICD-10-CM

## 2015-11-16 DIAGNOSIS — J3089 Other allergic rhinitis: Secondary | ICD-10-CM | POA: Diagnosis not present

## 2015-11-17 DIAGNOSIS — J301 Allergic rhinitis due to pollen: Secondary | ICD-10-CM | POA: Diagnosis not present

## 2015-11-18 DIAGNOSIS — J3081 Allergic rhinitis due to animal (cat) (dog) hair and dander: Secondary | ICD-10-CM | POA: Diagnosis not present

## 2015-12-16 ENCOUNTER — Ambulatory Visit (INDEPENDENT_AMBULATORY_CARE_PROVIDER_SITE_OTHER): Payer: 59 | Admitting: *Deleted

## 2015-12-16 DIAGNOSIS — J3089 Other allergic rhinitis: Secondary | ICD-10-CM

## 2015-12-23 ENCOUNTER — Ambulatory Visit (INDEPENDENT_AMBULATORY_CARE_PROVIDER_SITE_OTHER): Payer: 59 | Admitting: *Deleted

## 2015-12-23 DIAGNOSIS — J3089 Other allergic rhinitis: Secondary | ICD-10-CM

## 2015-12-29 ENCOUNTER — Ambulatory Visit (INDEPENDENT_AMBULATORY_CARE_PROVIDER_SITE_OTHER): Payer: 59 | Admitting: *Deleted

## 2015-12-29 DIAGNOSIS — J3089 Other allergic rhinitis: Secondary | ICD-10-CM

## 2016-01-07 ENCOUNTER — Ambulatory Visit (INDEPENDENT_AMBULATORY_CARE_PROVIDER_SITE_OTHER): Payer: 59

## 2016-01-07 DIAGNOSIS — J3089 Other allergic rhinitis: Secondary | ICD-10-CM | POA: Diagnosis not present

## 2016-01-13 ENCOUNTER — Ambulatory Visit (INDEPENDENT_AMBULATORY_CARE_PROVIDER_SITE_OTHER): Payer: 59

## 2016-01-13 DIAGNOSIS — J3089 Other allergic rhinitis: Secondary | ICD-10-CM

## 2016-01-20 ENCOUNTER — Ambulatory Visit (INDEPENDENT_AMBULATORY_CARE_PROVIDER_SITE_OTHER): Payer: 59

## 2016-01-20 DIAGNOSIS — J3089 Other allergic rhinitis: Secondary | ICD-10-CM

## 2016-01-27 ENCOUNTER — Ambulatory Visit (INDEPENDENT_AMBULATORY_CARE_PROVIDER_SITE_OTHER): Payer: 59 | Admitting: *Deleted

## 2016-01-27 DIAGNOSIS — J3089 Other allergic rhinitis: Secondary | ICD-10-CM

## 2016-02-04 ENCOUNTER — Ambulatory Visit (INDEPENDENT_AMBULATORY_CARE_PROVIDER_SITE_OTHER): Payer: 59

## 2016-02-04 DIAGNOSIS — J309 Allergic rhinitis, unspecified: Secondary | ICD-10-CM | POA: Diagnosis not present

## 2016-02-10 ENCOUNTER — Ambulatory Visit (INDEPENDENT_AMBULATORY_CARE_PROVIDER_SITE_OTHER): Payer: 59

## 2016-02-10 DIAGNOSIS — J309 Allergic rhinitis, unspecified: Secondary | ICD-10-CM | POA: Diagnosis not present

## 2016-02-18 ENCOUNTER — Ambulatory Visit (INDEPENDENT_AMBULATORY_CARE_PROVIDER_SITE_OTHER): Payer: 59

## 2016-02-18 DIAGNOSIS — J309 Allergic rhinitis, unspecified: Secondary | ICD-10-CM

## 2016-02-26 NOTE — Addendum Note (Signed)
Addended by: Felipa Emory on: 02/26/2016 08:49 AM   Modules accepted: Orders

## 2016-04-14 ENCOUNTER — Ambulatory Visit (INDEPENDENT_AMBULATORY_CARE_PROVIDER_SITE_OTHER): Payer: 59

## 2016-04-14 DIAGNOSIS — J309 Allergic rhinitis, unspecified: Secondary | ICD-10-CM

## 2016-04-21 ENCOUNTER — Ambulatory Visit (INDEPENDENT_AMBULATORY_CARE_PROVIDER_SITE_OTHER): Payer: 59 | Admitting: *Deleted

## 2016-04-21 DIAGNOSIS — J309 Allergic rhinitis, unspecified: Secondary | ICD-10-CM

## 2016-04-27 ENCOUNTER — Ambulatory Visit (INDEPENDENT_AMBULATORY_CARE_PROVIDER_SITE_OTHER): Payer: 59 | Admitting: *Deleted

## 2016-04-27 DIAGNOSIS — J309 Allergic rhinitis, unspecified: Secondary | ICD-10-CM | POA: Diagnosis not present

## 2016-05-09 ENCOUNTER — Ambulatory Visit (INDEPENDENT_AMBULATORY_CARE_PROVIDER_SITE_OTHER): Payer: 59 | Admitting: *Deleted

## 2016-05-09 DIAGNOSIS — J309 Allergic rhinitis, unspecified: Secondary | ICD-10-CM

## 2016-05-17 ENCOUNTER — Ambulatory Visit (INDEPENDENT_AMBULATORY_CARE_PROVIDER_SITE_OTHER): Payer: 59 | Admitting: *Deleted

## 2016-05-17 DIAGNOSIS — J309 Allergic rhinitis, unspecified: Secondary | ICD-10-CM | POA: Diagnosis not present

## 2016-05-25 ENCOUNTER — Ambulatory Visit (INDEPENDENT_AMBULATORY_CARE_PROVIDER_SITE_OTHER): Payer: 59

## 2016-05-25 DIAGNOSIS — J3089 Other allergic rhinitis: Secondary | ICD-10-CM | POA: Diagnosis not present

## 2016-05-29 DIAGNOSIS — Z0000 Encounter for general adult medical examination without abnormal findings: Secondary | ICD-10-CM | POA: Diagnosis not present

## 2016-05-29 DIAGNOSIS — E291 Testicular hypofunction: Secondary | ICD-10-CM | POA: Diagnosis not present

## 2016-05-29 DIAGNOSIS — Z125 Encounter for screening for malignant neoplasm of prostate: Secondary | ICD-10-CM | POA: Diagnosis not present

## 2016-06-02 ENCOUNTER — Ambulatory Visit (INDEPENDENT_AMBULATORY_CARE_PROVIDER_SITE_OTHER): Payer: 59

## 2016-06-02 DIAGNOSIS — J309 Allergic rhinitis, unspecified: Secondary | ICD-10-CM | POA: Diagnosis not present

## 2016-06-06 ENCOUNTER — Ambulatory Visit (INDEPENDENT_AMBULATORY_CARE_PROVIDER_SITE_OTHER): Payer: 59 | Admitting: *Deleted

## 2016-06-06 DIAGNOSIS — J309 Allergic rhinitis, unspecified: Secondary | ICD-10-CM | POA: Diagnosis not present

## 2016-06-16 ENCOUNTER — Ambulatory Visit (INDEPENDENT_AMBULATORY_CARE_PROVIDER_SITE_OTHER): Payer: 59

## 2016-06-16 DIAGNOSIS — J309 Allergic rhinitis, unspecified: Secondary | ICD-10-CM | POA: Diagnosis not present

## 2016-06-21 ENCOUNTER — Encounter: Payer: Self-pay | Admitting: *Deleted

## 2016-06-21 NOTE — Progress Notes (Signed)
Maintenance vials made  

## 2016-06-22 ENCOUNTER — Ambulatory Visit (INDEPENDENT_AMBULATORY_CARE_PROVIDER_SITE_OTHER): Payer: 59 | Admitting: *Deleted

## 2016-06-22 DIAGNOSIS — Z1283 Encounter for screening for malignant neoplasm of skin: Secondary | ICD-10-CM | POA: Diagnosis not present

## 2016-06-22 DIAGNOSIS — L821 Other seborrheic keratosis: Secondary | ICD-10-CM | POA: Diagnosis not present

## 2016-06-22 DIAGNOSIS — J309 Allergic rhinitis, unspecified: Secondary | ICD-10-CM | POA: Diagnosis not present

## 2016-06-22 DIAGNOSIS — D225 Melanocytic nevi of trunk: Secondary | ICD-10-CM | POA: Diagnosis not present

## 2016-06-22 DIAGNOSIS — L718 Other rosacea: Secondary | ICD-10-CM | POA: Diagnosis not present

## 2016-06-23 DIAGNOSIS — J3089 Other allergic rhinitis: Secondary | ICD-10-CM | POA: Diagnosis not present

## 2016-06-30 ENCOUNTER — Ambulatory Visit (INDEPENDENT_AMBULATORY_CARE_PROVIDER_SITE_OTHER): Payer: 59

## 2016-06-30 DIAGNOSIS — J309 Allergic rhinitis, unspecified: Secondary | ICD-10-CM

## 2016-07-11 DIAGNOSIS — N419 Inflammatory disease of prostate, unspecified: Secondary | ICD-10-CM | POA: Diagnosis not present

## 2016-07-11 DIAGNOSIS — R350 Frequency of micturition: Secondary | ICD-10-CM | POA: Diagnosis not present

## 2016-07-12 ENCOUNTER — Ambulatory Visit (INDEPENDENT_AMBULATORY_CARE_PROVIDER_SITE_OTHER): Payer: 59

## 2016-07-12 DIAGNOSIS — J309 Allergic rhinitis, unspecified: Secondary | ICD-10-CM | POA: Diagnosis not present

## 2016-07-19 ENCOUNTER — Ambulatory Visit (INDEPENDENT_AMBULATORY_CARE_PROVIDER_SITE_OTHER): Payer: 59 | Admitting: *Deleted

## 2016-07-19 DIAGNOSIS — J309 Allergic rhinitis, unspecified: Secondary | ICD-10-CM

## 2016-07-24 DIAGNOSIS — M545 Low back pain: Secondary | ICD-10-CM | POA: Diagnosis not present

## 2016-07-24 DIAGNOSIS — M438X6 Other specified deforming dorsopathies, lumbar region: Secondary | ICD-10-CM | POA: Diagnosis not present

## 2016-07-24 DIAGNOSIS — M47816 Spondylosis without myelopathy or radiculopathy, lumbar region: Secondary | ICD-10-CM | POA: Diagnosis not present

## 2016-07-24 DIAGNOSIS — M549 Dorsalgia, unspecified: Secondary | ICD-10-CM | POA: Diagnosis not present

## 2016-07-31 ENCOUNTER — Ambulatory Visit (INDEPENDENT_AMBULATORY_CARE_PROVIDER_SITE_OTHER): Payer: 59 | Admitting: *Deleted

## 2016-07-31 DIAGNOSIS — J309 Allergic rhinitis, unspecified: Secondary | ICD-10-CM | POA: Diagnosis not present

## 2016-08-10 ENCOUNTER — Ambulatory Visit (INDEPENDENT_AMBULATORY_CARE_PROVIDER_SITE_OTHER): Payer: 59 | Admitting: *Deleted

## 2016-08-10 DIAGNOSIS — J309 Allergic rhinitis, unspecified: Secondary | ICD-10-CM

## 2016-08-15 ENCOUNTER — Ambulatory Visit (INDEPENDENT_AMBULATORY_CARE_PROVIDER_SITE_OTHER): Payer: 59 | Admitting: *Deleted

## 2016-08-15 DIAGNOSIS — J309 Allergic rhinitis, unspecified: Secondary | ICD-10-CM | POA: Diagnosis not present

## 2016-08-25 ENCOUNTER — Ambulatory Visit (INDEPENDENT_AMBULATORY_CARE_PROVIDER_SITE_OTHER): Payer: 59

## 2016-08-25 DIAGNOSIS — J309 Allergic rhinitis, unspecified: Secondary | ICD-10-CM

## 2016-08-28 DIAGNOSIS — N400 Enlarged prostate without lower urinary tract symptoms: Secondary | ICD-10-CM | POA: Diagnosis not present

## 2016-08-28 DIAGNOSIS — E291 Testicular hypofunction: Secondary | ICD-10-CM | POA: Diagnosis not present

## 2016-08-28 DIAGNOSIS — R030 Elevated blood-pressure reading, without diagnosis of hypertension: Secondary | ICD-10-CM | POA: Diagnosis not present

## 2016-09-05 ENCOUNTER — Ambulatory Visit (INDEPENDENT_AMBULATORY_CARE_PROVIDER_SITE_OTHER): Payer: 59 | Admitting: *Deleted

## 2016-09-05 DIAGNOSIS — J309 Allergic rhinitis, unspecified: Secondary | ICD-10-CM

## 2016-09-06 DIAGNOSIS — J3089 Other allergic rhinitis: Secondary | ICD-10-CM | POA: Diagnosis not present

## 2016-09-06 NOTE — Progress Notes (Signed)
VIALS EXP 09-06-17

## 2016-09-25 ENCOUNTER — Ambulatory Visit (INDEPENDENT_AMBULATORY_CARE_PROVIDER_SITE_OTHER): Payer: 59 | Admitting: *Deleted

## 2016-09-25 DIAGNOSIS — J309 Allergic rhinitis, unspecified: Secondary | ICD-10-CM | POA: Diagnosis not present

## 2016-10-11 ENCOUNTER — Ambulatory Visit (INDEPENDENT_AMBULATORY_CARE_PROVIDER_SITE_OTHER): Payer: 59 | Admitting: *Deleted

## 2016-10-11 DIAGNOSIS — J309 Allergic rhinitis, unspecified: Secondary | ICD-10-CM | POA: Diagnosis not present

## 2016-10-26 ENCOUNTER — Ambulatory Visit (INDEPENDENT_AMBULATORY_CARE_PROVIDER_SITE_OTHER): Payer: 59 | Admitting: *Deleted

## 2016-10-26 DIAGNOSIS — J309 Allergic rhinitis, unspecified: Secondary | ICD-10-CM | POA: Diagnosis not present

## 2016-11-08 ENCOUNTER — Ambulatory Visit (INDEPENDENT_AMBULATORY_CARE_PROVIDER_SITE_OTHER): Payer: 59 | Admitting: *Deleted

## 2016-11-08 DIAGNOSIS — J309 Allergic rhinitis, unspecified: Secondary | ICD-10-CM | POA: Diagnosis not present

## 2016-11-23 ENCOUNTER — Ambulatory Visit (INDEPENDENT_AMBULATORY_CARE_PROVIDER_SITE_OTHER): Payer: 59 | Admitting: *Deleted

## 2016-11-23 DIAGNOSIS — J309 Allergic rhinitis, unspecified: Secondary | ICD-10-CM

## 2016-11-28 ENCOUNTER — Ambulatory Visit (INDEPENDENT_AMBULATORY_CARE_PROVIDER_SITE_OTHER): Payer: 59 | Admitting: *Deleted

## 2016-11-28 DIAGNOSIS — J309 Allergic rhinitis, unspecified: Secondary | ICD-10-CM | POA: Diagnosis not present

## 2016-12-07 ENCOUNTER — Ambulatory Visit (INDEPENDENT_AMBULATORY_CARE_PROVIDER_SITE_OTHER): Payer: 59 | Admitting: *Deleted

## 2016-12-07 DIAGNOSIS — J309 Allergic rhinitis, unspecified: Secondary | ICD-10-CM | POA: Diagnosis not present

## 2016-12-14 ENCOUNTER — Ambulatory Visit (INDEPENDENT_AMBULATORY_CARE_PROVIDER_SITE_OTHER): Payer: 59 | Admitting: *Deleted

## 2016-12-14 DIAGNOSIS — J309 Allergic rhinitis, unspecified: Secondary | ICD-10-CM | POA: Diagnosis not present

## 2016-12-21 ENCOUNTER — Ambulatory Visit (INDEPENDENT_AMBULATORY_CARE_PROVIDER_SITE_OTHER): Payer: 59 | Admitting: *Deleted

## 2016-12-21 DIAGNOSIS — J309 Allergic rhinitis, unspecified: Secondary | ICD-10-CM | POA: Diagnosis not present

## 2017-01-04 ENCOUNTER — Ambulatory Visit (INDEPENDENT_AMBULATORY_CARE_PROVIDER_SITE_OTHER): Payer: 59 | Admitting: *Deleted

## 2017-01-04 DIAGNOSIS — J309 Allergic rhinitis, unspecified: Secondary | ICD-10-CM

## 2017-01-16 ENCOUNTER — Ambulatory Visit (INDEPENDENT_AMBULATORY_CARE_PROVIDER_SITE_OTHER): Payer: 59 | Admitting: *Deleted

## 2017-01-16 DIAGNOSIS — J309 Allergic rhinitis, unspecified: Secondary | ICD-10-CM

## 2017-02-02 ENCOUNTER — Ambulatory Visit (INDEPENDENT_AMBULATORY_CARE_PROVIDER_SITE_OTHER): Payer: 59

## 2017-02-02 DIAGNOSIS — J309 Allergic rhinitis, unspecified: Secondary | ICD-10-CM | POA: Diagnosis not present

## 2017-02-14 ENCOUNTER — Encounter: Payer: Self-pay | Admitting: *Deleted

## 2017-02-14 DIAGNOSIS — J3089 Other allergic rhinitis: Secondary | ICD-10-CM | POA: Diagnosis not present

## 2017-02-14 NOTE — Progress Notes (Signed)
VIALS MADE EXP: 02-14-18. HV

## 2017-02-16 ENCOUNTER — Ambulatory Visit (INDEPENDENT_AMBULATORY_CARE_PROVIDER_SITE_OTHER): Payer: 59

## 2017-02-16 DIAGNOSIS — J309 Allergic rhinitis, unspecified: Secondary | ICD-10-CM

## 2017-02-23 DIAGNOSIS — E291 Testicular hypofunction: Secondary | ICD-10-CM | POA: Diagnosis not present

## 2017-03-01 ENCOUNTER — Ambulatory Visit (INDEPENDENT_AMBULATORY_CARE_PROVIDER_SITE_OTHER): Payer: 59 | Admitting: *Deleted

## 2017-03-01 DIAGNOSIS — J309 Allergic rhinitis, unspecified: Secondary | ICD-10-CM

## 2017-04-02 ENCOUNTER — Ambulatory Visit (INDEPENDENT_AMBULATORY_CARE_PROVIDER_SITE_OTHER): Payer: 59

## 2017-04-02 DIAGNOSIS — J309 Allergic rhinitis, unspecified: Secondary | ICD-10-CM

## 2017-04-20 ENCOUNTER — Ambulatory Visit (INDEPENDENT_AMBULATORY_CARE_PROVIDER_SITE_OTHER): Payer: 59

## 2017-04-20 DIAGNOSIS — J309 Allergic rhinitis, unspecified: Secondary | ICD-10-CM | POA: Diagnosis not present

## 2017-04-30 ENCOUNTER — Ambulatory Visit (INDEPENDENT_AMBULATORY_CARE_PROVIDER_SITE_OTHER): Payer: 59 | Admitting: *Deleted

## 2017-04-30 DIAGNOSIS — J309 Allergic rhinitis, unspecified: Secondary | ICD-10-CM | POA: Diagnosis not present

## 2017-05-11 ENCOUNTER — Ambulatory Visit (INDEPENDENT_AMBULATORY_CARE_PROVIDER_SITE_OTHER): Payer: 59

## 2017-05-11 DIAGNOSIS — J309 Allergic rhinitis, unspecified: Secondary | ICD-10-CM

## 2017-05-17 ENCOUNTER — Ambulatory Visit (INDEPENDENT_AMBULATORY_CARE_PROVIDER_SITE_OTHER): Payer: 59 | Admitting: *Deleted

## 2017-05-17 DIAGNOSIS — J309 Allergic rhinitis, unspecified: Secondary | ICD-10-CM | POA: Diagnosis not present

## 2017-05-23 ENCOUNTER — Ambulatory Visit (INDEPENDENT_AMBULATORY_CARE_PROVIDER_SITE_OTHER): Payer: 59 | Admitting: *Deleted

## 2017-05-23 DIAGNOSIS — J309 Allergic rhinitis, unspecified: Secondary | ICD-10-CM

## 2017-05-30 DIAGNOSIS — E291 Testicular hypofunction: Secondary | ICD-10-CM | POA: Diagnosis not present

## 2017-05-30 DIAGNOSIS — Z13220 Encounter for screening for lipoid disorders: Secondary | ICD-10-CM | POA: Diagnosis not present

## 2017-05-30 DIAGNOSIS — Z0000 Encounter for general adult medical examination without abnormal findings: Secondary | ICD-10-CM | POA: Diagnosis not present

## 2017-06-05 ENCOUNTER — Ambulatory Visit (INDEPENDENT_AMBULATORY_CARE_PROVIDER_SITE_OTHER): Payer: 59 | Admitting: *Deleted

## 2017-06-05 DIAGNOSIS — J309 Allergic rhinitis, unspecified: Secondary | ICD-10-CM

## 2017-06-18 ENCOUNTER — Ambulatory Visit (INDEPENDENT_AMBULATORY_CARE_PROVIDER_SITE_OTHER): Payer: 59 | Admitting: *Deleted

## 2017-06-18 DIAGNOSIS — J309 Allergic rhinitis, unspecified: Secondary | ICD-10-CM

## 2017-06-27 NOTE — Progress Notes (Signed)
VIALS EXP 06-28-18 

## 2017-06-29 DIAGNOSIS — J3089 Other allergic rhinitis: Secondary | ICD-10-CM | POA: Diagnosis not present

## 2017-07-05 ENCOUNTER — Ambulatory Visit (INDEPENDENT_AMBULATORY_CARE_PROVIDER_SITE_OTHER): Payer: 59 | Admitting: *Deleted

## 2017-07-05 DIAGNOSIS — J309 Allergic rhinitis, unspecified: Secondary | ICD-10-CM | POA: Diagnosis not present

## 2017-07-27 ENCOUNTER — Ambulatory Visit (INDEPENDENT_AMBULATORY_CARE_PROVIDER_SITE_OTHER): Payer: 59

## 2017-07-27 DIAGNOSIS — J309 Allergic rhinitis, unspecified: Secondary | ICD-10-CM | POA: Diagnosis not present

## 2017-08-13 ENCOUNTER — Ambulatory Visit (INDEPENDENT_AMBULATORY_CARE_PROVIDER_SITE_OTHER): Payer: 59

## 2017-08-13 DIAGNOSIS — J309 Allergic rhinitis, unspecified: Secondary | ICD-10-CM | POA: Diagnosis not present

## 2017-08-29 ENCOUNTER — Ambulatory Visit (INDEPENDENT_AMBULATORY_CARE_PROVIDER_SITE_OTHER): Payer: 59 | Admitting: *Deleted

## 2017-08-29 DIAGNOSIS — J309 Allergic rhinitis, unspecified: Secondary | ICD-10-CM | POA: Diagnosis not present

## 2017-09-06 ENCOUNTER — Ambulatory Visit (INDEPENDENT_AMBULATORY_CARE_PROVIDER_SITE_OTHER): Payer: 59 | Admitting: *Deleted

## 2017-09-06 DIAGNOSIS — J309 Allergic rhinitis, unspecified: Secondary | ICD-10-CM

## 2017-09-14 ENCOUNTER — Ambulatory Visit (INDEPENDENT_AMBULATORY_CARE_PROVIDER_SITE_OTHER): Payer: 59

## 2017-09-14 DIAGNOSIS — J309 Allergic rhinitis, unspecified: Secondary | ICD-10-CM

## 2017-09-25 ENCOUNTER — Ambulatory Visit (INDEPENDENT_AMBULATORY_CARE_PROVIDER_SITE_OTHER): Payer: 59 | Admitting: *Deleted

## 2017-09-25 DIAGNOSIS — J309 Allergic rhinitis, unspecified: Secondary | ICD-10-CM

## 2017-10-04 ENCOUNTER — Ambulatory Visit (INDEPENDENT_AMBULATORY_CARE_PROVIDER_SITE_OTHER): Payer: 59 | Admitting: *Deleted

## 2017-10-04 DIAGNOSIS — J309 Allergic rhinitis, unspecified: Secondary | ICD-10-CM | POA: Diagnosis not present

## 2017-10-25 ENCOUNTER — Ambulatory Visit (INDEPENDENT_AMBULATORY_CARE_PROVIDER_SITE_OTHER): Payer: 59 | Admitting: *Deleted

## 2017-10-25 DIAGNOSIS — J309 Allergic rhinitis, unspecified: Secondary | ICD-10-CM | POA: Diagnosis not present

## 2017-11-22 ENCOUNTER — Ambulatory Visit (INDEPENDENT_AMBULATORY_CARE_PROVIDER_SITE_OTHER): Payer: 59

## 2017-11-22 DIAGNOSIS — J309 Allergic rhinitis, unspecified: Secondary | ICD-10-CM

## 2017-12-24 ENCOUNTER — Ambulatory Visit (INDEPENDENT_AMBULATORY_CARE_PROVIDER_SITE_OTHER): Payer: 59 | Admitting: *Deleted

## 2017-12-24 DIAGNOSIS — J309 Allergic rhinitis, unspecified: Secondary | ICD-10-CM | POA: Diagnosis not present

## 2018-01-15 DIAGNOSIS — J3089 Other allergic rhinitis: Secondary | ICD-10-CM

## 2018-01-15 NOTE — Progress Notes (Signed)
3RD VIAL EXP 01-16-19

## 2018-01-15 NOTE — Progress Notes (Signed)
VIALS EXP 01-16-19

## 2018-01-21 ENCOUNTER — Ambulatory Visit (INDEPENDENT_AMBULATORY_CARE_PROVIDER_SITE_OTHER): Payer: 59 | Admitting: *Deleted

## 2018-01-21 DIAGNOSIS — J309 Allergic rhinitis, unspecified: Secondary | ICD-10-CM

## 2018-03-04 ENCOUNTER — Ambulatory Visit (INDEPENDENT_AMBULATORY_CARE_PROVIDER_SITE_OTHER): Payer: 59 | Admitting: *Deleted

## 2018-03-04 DIAGNOSIS — J309 Allergic rhinitis, unspecified: Secondary | ICD-10-CM | POA: Diagnosis not present

## 2018-04-05 ENCOUNTER — Ambulatory Visit (INDEPENDENT_AMBULATORY_CARE_PROVIDER_SITE_OTHER): Payer: 59 | Admitting: *Deleted

## 2018-04-05 DIAGNOSIS — J309 Allergic rhinitis, unspecified: Secondary | ICD-10-CM

## 2018-05-09 ENCOUNTER — Ambulatory Visit (INDEPENDENT_AMBULATORY_CARE_PROVIDER_SITE_OTHER): Payer: 59

## 2018-05-09 DIAGNOSIS — J309 Allergic rhinitis, unspecified: Secondary | ICD-10-CM

## 2018-05-16 ENCOUNTER — Ambulatory Visit (INDEPENDENT_AMBULATORY_CARE_PROVIDER_SITE_OTHER): Payer: 59 | Admitting: *Deleted

## 2018-05-16 DIAGNOSIS — J309 Allergic rhinitis, unspecified: Secondary | ICD-10-CM | POA: Diagnosis not present

## 2018-05-24 ENCOUNTER — Ambulatory Visit (INDEPENDENT_AMBULATORY_CARE_PROVIDER_SITE_OTHER): Payer: 59 | Admitting: *Deleted

## 2018-05-24 DIAGNOSIS — J309 Allergic rhinitis, unspecified: Secondary | ICD-10-CM

## 2018-05-30 ENCOUNTER — Ambulatory Visit (INDEPENDENT_AMBULATORY_CARE_PROVIDER_SITE_OTHER): Payer: 59 | Admitting: *Deleted

## 2018-05-30 DIAGNOSIS — J309 Allergic rhinitis, unspecified: Secondary | ICD-10-CM | POA: Diagnosis not present

## 2018-06-07 ENCOUNTER — Ambulatory Visit (INDEPENDENT_AMBULATORY_CARE_PROVIDER_SITE_OTHER): Payer: 59 | Admitting: *Deleted

## 2018-06-07 DIAGNOSIS — J309 Allergic rhinitis, unspecified: Secondary | ICD-10-CM

## 2018-06-17 DIAGNOSIS — Z0000 Encounter for general adult medical examination without abnormal findings: Secondary | ICD-10-CM | POA: Diagnosis not present

## 2018-06-17 DIAGNOSIS — E291 Testicular hypofunction: Secondary | ICD-10-CM | POA: Diagnosis not present

## 2018-06-17 DIAGNOSIS — Z125 Encounter for screening for malignant neoplasm of prostate: Secondary | ICD-10-CM | POA: Diagnosis not present

## 2018-06-17 DIAGNOSIS — I10 Essential (primary) hypertension: Secondary | ICD-10-CM | POA: Diagnosis not present

## 2018-07-03 ENCOUNTER — Ambulatory Visit (INDEPENDENT_AMBULATORY_CARE_PROVIDER_SITE_OTHER): Payer: 59 | Admitting: *Deleted

## 2018-07-03 DIAGNOSIS — J309 Allergic rhinitis, unspecified: Secondary | ICD-10-CM | POA: Diagnosis not present

## 2018-08-06 ENCOUNTER — Ambulatory Visit (INDEPENDENT_AMBULATORY_CARE_PROVIDER_SITE_OTHER): Payer: 59 | Admitting: *Deleted

## 2018-08-06 DIAGNOSIS — J309 Allergic rhinitis, unspecified: Secondary | ICD-10-CM | POA: Diagnosis not present

## 2018-09-05 ENCOUNTER — Ambulatory Visit (INDEPENDENT_AMBULATORY_CARE_PROVIDER_SITE_OTHER): Payer: 59

## 2018-09-05 DIAGNOSIS — J309 Allergic rhinitis, unspecified: Secondary | ICD-10-CM

## 2018-09-19 NOTE — Progress Notes (Signed)
VIALS EXP 09-23-2019

## 2018-09-23 DIAGNOSIS — J3089 Other allergic rhinitis: Secondary | ICD-10-CM

## 2018-10-08 ENCOUNTER — Ambulatory Visit (INDEPENDENT_AMBULATORY_CARE_PROVIDER_SITE_OTHER): Payer: 59 | Admitting: *Deleted

## 2018-10-08 DIAGNOSIS — J309 Allergic rhinitis, unspecified: Secondary | ICD-10-CM | POA: Diagnosis not present

## 2018-11-06 ENCOUNTER — Ambulatory Visit (INDEPENDENT_AMBULATORY_CARE_PROVIDER_SITE_OTHER): Payer: 59

## 2018-11-06 DIAGNOSIS — J309 Allergic rhinitis, unspecified: Secondary | ICD-10-CM

## 2018-12-06 ENCOUNTER — Ambulatory Visit (INDEPENDENT_AMBULATORY_CARE_PROVIDER_SITE_OTHER): Payer: 59 | Admitting: *Deleted

## 2018-12-06 DIAGNOSIS — J309 Allergic rhinitis, unspecified: Secondary | ICD-10-CM

## 2018-12-13 ENCOUNTER — Telehealth: Payer: Self-pay | Admitting: Allergy and Immunology

## 2018-12-13 NOTE — Telephone Encounter (Signed)
Called patient to schedule office visit to continue shot for insurance purposes. Left message for patient to call back.

## 2020-09-20 ENCOUNTER — Other Ambulatory Visit: Payer: Self-pay | Admitting: Surgery

## 2020-09-20 ENCOUNTER — Other Ambulatory Visit (HOSPITAL_COMMUNITY): Payer: Self-pay | Admitting: Surgery

## 2020-09-20 DIAGNOSIS — E669 Obesity, unspecified: Secondary | ICD-10-CM

## 2020-10-27 ENCOUNTER — Ambulatory Visit: Payer: Self-pay | Admitting: Skilled Nursing Facility1

## 2022-01-23 ENCOUNTER — Other Ambulatory Visit (HOSPITAL_BASED_OUTPATIENT_CLINIC_OR_DEPARTMENT_OTHER): Payer: Self-pay | Admitting: Family Medicine

## 2022-01-23 DIAGNOSIS — E7800 Pure hypercholesterolemia, unspecified: Secondary | ICD-10-CM

## 2022-02-23 ENCOUNTER — Ambulatory Visit (HOSPITAL_BASED_OUTPATIENT_CLINIC_OR_DEPARTMENT_OTHER)
Admission: RE | Admit: 2022-02-23 | Discharge: 2022-02-23 | Disposition: A | Discharge status: Home / Self Care | Payer: Self-pay | Source: Ambulatory Visit | Attending: Family Medicine | Admitting: Family Medicine

## 2022-02-23 DIAGNOSIS — E7800 Pure hypercholesterolemia, unspecified: Secondary | ICD-10-CM | POA: Insufficient documentation
